# Patient Record
Sex: Female | Born: 1935 | Race: White | Hispanic: No
Health system: Southern US, Community
[De-identification: ages and names within clinical notes are randomized; demographics above are authoritative.]

## PROBLEM LIST (undated history)

## (undated) HISTORY — PX: BREAST CYST ASPIRATION: SHX578

## (undated) HISTORY — PX: BREAST EXCISIONAL BIOPSY: SUR124

---

## 2004-02-05 ENCOUNTER — Ambulatory Visit (HOSPITAL_COMMUNITY): Admission: RE | Admit: 2004-02-05 | Discharge: 2004-02-05 | Payer: Self-pay | Admitting: Gastroenterology

## 2007-03-17 ENCOUNTER — Ambulatory Visit: Payer: Self-pay | Admitting: Internal Medicine

## 2012-10-19 ENCOUNTER — Observation Stay: Payer: Self-pay | Admitting: Internal Medicine

## 2012-10-19 LAB — COMPREHENSIVE METABOLIC PANEL
Albumin: 4.2 g/dL (ref 3.4–5.0)
Alkaline Phosphatase: 109 U/L (ref 50–136)
Anion Gap: 12 (ref 7–16)
Chloride: 107 mmol/L (ref 98–107)
EGFR (Non-African Amer.): >60
Glucose: 89 mg/dL (ref 65–99)
Osmolality: 286 (ref 275–301)
Potassium: 4 mmol/L (ref 3.5–5.1)
SGOT(AST): 24 U/L (ref 15–37)
Sodium: 142 mmol/L (ref 136–145)
Total Protein: 7.2 g/dL (ref 6.4–8.2)

## 2012-10-19 LAB — CBC
HCT: 45.4 % (ref 35.0–47.0)
MCH: 30.4 pg (ref 26.0–34.0)
MCV: 89 fL (ref 80–100)
Platelet: 301 10*3/uL (ref 150–440)
RDW: 13.8 % (ref 11.5–14.5)

## 2012-10-19 LAB — URINALYSIS, COMPLETE
Nitrite: NEGATIVE
Protein: NEGATIVE
RBC,UR: 1 /HPF (ref 0–5)
Specific Gravity: 1.005 (ref 1.003–1.030)
WBC UR: 3 /HPF (ref 0–5)

## 2012-10-19 LAB — TROPONIN I: Troponin-I: <0.02 ng/mL

## 2012-10-19 LAB — CK TOTAL AND CKMB (NOT AT ARMC)
CK, Total: 65 U/L (ref 21–215)
CK-MB: <0.5 ng/mL — ABNORMAL LOW (ref 0.5–3.6)

## 2012-10-20 LAB — LIPID PANEL
HDL Cholesterol: 38 mg/dL — ABNORMAL LOW (ref 40–60)
VLDL Cholesterol, Calc: 56 mg/dL — ABNORMAL HIGH (ref 5–40)

## 2015-04-01 NOTE — Discharge Summary (Signed)
PATIENT NAME:  Cheyenne HansenROLAND, Cheyenne Glover#:  [id number] DATE OF BIRTH:  05/11/1934  DATE OF ADMISSION:  10/19/2012 DATE OF DISCHARGE:  10/20/2012  PRESENTING COMPLAINT: Right upper extremity weakness and dysarthria.   DISCHARGE DIAGNOSES:  1. Suspected TIA, resolved.  2. Accelerated hypertension.  3. Hyperlipidemia.   CONDITION ON DISCHARGE: Fair. Vitals stable.  MEDICATIONS. 1. Restasis 0.05 ophthalmic drops one drop to each affected eye b.i.d.  2. Coreg 12.5 mg p.o. b.i.d.  3. Lisinopril 40 mg p.o. daily.  4. Clonidine 0.1 mg p.o. b.i.d.  5. Dicyclomine 1 capsule orally as needed.  6. Aspirin 325 mg p.o. daily.  7. Zetia 10 mg p.o. daily.   DIET: Low sodium, low fat, cholesterol diet.   FOLLOW-UP: Follow-up with [facility] doctor, Dr. Dayna BarkerAldridge, on November 18th at 10:30 a.m.   LABORATORY, DIAGNOSTIC, AND RADIOLOGICAL DATA: MRI of the brain negative for acute infarct.   Ultrasound carotid Doppler no significant stenosis. Scattered atherosclerotic disease.   Cholesterol 226, triglycerides 280, LDL 132. CBC within normal limits. Comprehensive metabolic panel within normal limits. Urinalysis negative for UTI.   CT head no acute or focal stroke.   BRIEF SUMMARY OF HOSPITAL COURSE: Ms. Cheyenne Glover is a pleasant 81 year old Caucasian female who has history of hypertension and hyperlipidemia who came to the Emergency Room with:  1. Possible TIA. The patient presented with symptoms of right upper extremity weakness and some dysarthria. She was at church, started dropping objects from her right hand and felt weak and poorly. Her blood pressure had been elevated in the 200's systolic. She was started on aspirin 325 mg p.o. daily. The patient's symptoms resolved in 2 to 3 hours. She is back to her baseline. Her work-up for stroke essentially is negative. The patient does not need any physical therapy at this time. She is back at her baseline.  2. Hypertension, labile. The patient is currently on  Coreg, lisinopril, and clonidine. Her Coreg dose was increased to 12.5 recently. The patient is advised to keep a log of her blood pressure. Follow-up with primary care physician and adjustment of BP meds can be done as outpatient.  3. Irritable bowel syndrome. P.r.n. dicyclomine.  4. Hyperlipidemia. The patient has been intolerant to statins. I discussed with her regarding starting Zetia. She is okay to try it, hence, a prescription for Zetia has been given.   Hospital stay otherwise remained stable. The patient's discharge plan was discussed with her family members.   CODE STATUS: She remained a FULL CODE.   TIME SPENT: 40 minutes.   ____________________________ Wylie HailSona A. Allena KatzPatel, MD sap:drc D: 10/20/2012 14:54:38 ET T: 10/21/2012 15:34:11 ET JOB#: [id number]  cc: Isatu Macinnes A. Allena KatzPatel, MD, <Dictator> Katina DungBarbara D. Dayna BarkerAldridge, MD Willow OraSONA A Raffaele Derise MD ELECTRONICALLY SIGNED 10/24/2012 1:24

## 2015-04-01 NOTE — H&P (Signed)
PATIENT NAME:  Cheyenne Glover, Cheyenne Glover MR#:  [id number] DATE OF BIRTH:  [birth year]  DATE OF ADMISSION:  10/19/2012  REFERRING PHYSICIAN: ER physician, Dr. Enedina Finner    PRIMARY CARE PHYSICIAN: Dr. Dayna Barker   CARDIOLOGIST: Dr. Gwen Pounds   CHIEF COMPLAINT: Slurred speech, right hand numbness, weakness.   HISTORY OF PRESENT ILLNESS: The patient is a 81 year old female with past medical history of irritable bowel syndrome and hypertension who reports that four days ago she went to see her PCP and was having uncontrolled hypertension with systolic blood pressure in the 200's, therefore, the dose of her Coreg was increased from 6.25 to 12.5 twice a day. She has been taking the medications for the last few days and this morning her blood pressure was low at 94/54. She went to church with a friend and over there she noticed that her speech was slurred. By the time she got home, which was about 10 minutes later, she noticed that her right hand, especially the thumb and the index finger, felt numb and her grip strength did not feel good. She was dropping objects from the right hand. By the time she came to the Emergency Room, her symptoms had started resolving and she currently feels back to her baseline. Dr. Gwen Pounds is her cardiologist and he was planning to do an echo on her tomorrow. She also takes ibuprofen frequently for arthritis pain and she was advised not to take it anymore and has not done so in the last few days.   ALLERGIES: Codeine.  MEDICATIONS:  1. Clonidine 0.1 mg twice a day. 2. Coreg 12.5 mg twice a day.  3. Lisinopril 40 mg daily.  4. Restasis 0.05% one drop to each affected eye twice a day.  5. The patient also takes dicyclomine for irritable bowel syndrome/diarrhea.   PAST MEDICAL HISTORY:  1. Hypertension.  2. Irritable bowel syndrome with diarrhea.   PAST SURGICAL HISTORY:  1. Hysterectomy.  2. Appendectomy.  3. D and C. 4. Tonsillectomy.  5. Nose surgery.  6. Left shoulder rotator  cuff repair.   FAMILY HISTORY: Mother had chronic lung disease and died at the age of 37. Father had CHF and died at the age of 18.   SOCIAL HISTORY: She is married, lives with her husband. There is no history of smoking, alcohol, or drug abuse.   REVIEW OF SYSTEMS: CONSTITUTIONAL: Denies any fever, fatigue, weakness. EYES: Denies any blurred or double vision. ENT: Denies any tinnitus or ear pain. RESPIRATORY: Denies any cough or wheezing. CARDIOVASCULAR: Denies any chest pain or palpitations. GI: Denies any nausea, vomiting, abdominal pain. Has intermittent diarrhea due to irritable bowel syndrome. GU: Denies any dysuria or hematuria. ENDOCRINE: Denies any polyuria or nocturia. HEME/LYMPH: Denies any anemia or easy bruisability. INTEGUMENTARY: Denies any acne or rash. MUSCULOSKELETAL: Denies any swelling or gout. NEUROLOGICAL: Had some numbness and weakness in her right hand earlier. PSYCH: Denies any anxiety or depression.   PHYSICAL EXAMINATION:   VITAL SIGNS: Temperature 98.3, heart rate 59, respiratory rate 18, blood pressure 181/86, currently it is 131/58, pulse oximetry 95% on room air.   GENERAL: The patient is a 81 year old female sitting comfortably in bed not in acute distress.   HEAD: Atraumatic, normocephalic.   EYES: No pallor, icterus, or cyanosis. PERRLA. EOMI.   ENT: Wet mucous membranes. No oropharyngeal erythema or thrush.   NECK: Supple. No masses. No JVD. No thyromegaly. No lymphadenopathy.   CHEST WALL: No tenderness to palpation. Not using accessory muscles of respiration. No intercostal  muscle retractions.   LUNGS: Bilaterally clear to auscultation. No wheezing, rales, or rhonchi.   CARDIOVASCULAR: S1, S2 regular. No murmur, rubs, or gallops.   ABDOMEN: Soft, nontender, nondistended. No guarding. No rigidity. No organomegaly. Normal bowel sounds.   SKIN: No rashes or lesions.   PERIPHERIES: No pedal edema. 2+ pedal pulses.   MUSCULOSKELETAL: No cyanosis or  clubbing.   NEUROLOGIC: Awake, alert, oriented x3. Nonfocal neurological exam. Cranial nerves grossly intact.   PSYCH: Normal mood and affect.   RESULTS: CT of the head shows no acute abnormalities.   Urinalysis showed no evidence of infection. CBC is normal. CMP is also normal. Cardiac enzymes normal.   ASSESSMENT AND PLAN: This is a 81 year old female with past medical history of hypertension and irritable bowel syndrome who presents with right hand numbness, weakness, slurred speech.  1. Possible TIA. The patient's presenting symptoms have essentially resolved at present. Will admit her for observation. Monitor her on telemetry. Will get an MRI of the brain, echo, and carotid ultrasound. She occasionally takes a baby aspirin. Will give her a full dose while in the hospital and check a fasting lipid profile.  2. Hypertension. The patient reports that the dose of her medications was recently increased about four days ago because of uncontrolled blood pressure with systolic blood pressures running in the 200's. This morning her blood pressure was low. On presentation to the ER her blood pressure was high, currently it is 131/58. Will reduce the dose of her antihypertensive medications to avoid hypertension in view of her recent TIA symptoms.  3. Irritable bowel syndrome. Will continue p.r.n. dicyclomine.  Discussed with the ER physician, discussed with the patient and her daughter the plan of care and management.   TIME SPENT: 75 minutes.   ____________________________ Darrick MeigsSangeeta Shantese Raven, MD sp:drc D: 10/19/2012 15:42:24 ET T: 10/19/2012 16:05:43 ET JOB#: [id number]  cc: Darrick MeigsSangeeta Lener Ventresca, MD, <Dictator> Katina DungBarbara D. Dayna BarkerAldridge, MD Darrick MeigsSANGEETA Dianca Owensby MD ELECTRONICALLY SIGNED 10/19/2012 18:54

## 2018-08-29 ENCOUNTER — Other Ambulatory Visit: Payer: Self-pay | Admitting: Family Medicine

## 2018-08-29 DIAGNOSIS — Z780 Asymptomatic menopausal state: Secondary | ICD-10-CM

## 2019-10-08 ENCOUNTER — Other Ambulatory Visit: Payer: Self-pay | Admitting: Physician Assistant

## 2019-10-08 ENCOUNTER — Other Ambulatory Visit (HOSPITAL_COMMUNITY): Payer: Self-pay | Admitting: Physician Assistant

## 2019-10-08 DIAGNOSIS — M542 Cervicalgia: Secondary | ICD-10-CM

## 2019-10-08 DIAGNOSIS — R221 Localized swelling, mass and lump, neck: Secondary | ICD-10-CM

## 2019-10-15 ENCOUNTER — Other Ambulatory Visit: Payer: Self-pay

## 2019-10-15 ENCOUNTER — Ambulatory Visit: Payer: Medicare Other

## 2019-10-15 ENCOUNTER — Ambulatory Visit
Admission: RE | Admit: 2019-10-15 | Discharge: 2019-10-15 | Disposition: A | Discharge status: Home / Self Care | Payer: Medicare Other | Source: Ambulatory Visit | Attending: Physician Assistant | Admitting: Physician Assistant

## 2019-10-15 DIAGNOSIS — R221 Localized swelling, mass and lump, neck: Secondary | ICD-10-CM | POA: Diagnosis not present

## 2019-10-15 DIAGNOSIS — M542 Cervicalgia: Secondary | ICD-10-CM | POA: Diagnosis present

## 2020-05-01 ENCOUNTER — Other Ambulatory Visit
Admission: RE | Admit: 2020-05-01 | Discharge: 2020-05-01 | Disposition: A | Discharge status: Home / Self Care | Payer: Medicare Other | Source: Home / Self Care | Attending: Physician Assistant | Admitting: Physician Assistant

## 2020-05-01 ENCOUNTER — Other Ambulatory Visit: Payer: Self-pay

## 2020-05-01 ENCOUNTER — Other Ambulatory Visit: Payer: Self-pay | Admitting: Physician Assistant

## 2020-05-01 ENCOUNTER — Ambulatory Visit
Admission: RE | Admit: 2020-05-01 | Discharge: 2020-05-01 | Disposition: A | Discharge status: Home / Self Care | Payer: Medicare Other | Source: Ambulatory Visit | Attending: Physician Assistant | Admitting: Physician Assistant

## 2020-05-01 DIAGNOSIS — R1032 Left lower quadrant pain: Secondary | ICD-10-CM | POA: Insufficient documentation

## 2020-05-01 LAB — CREATININE, SERUM
Creatinine, Ser: 1.05 mg/dL — ABNORMAL HIGH (ref 0.44–1.00)
GFR calc Af Amer: 57 mL/min — ABNORMAL LOW (ref >60)
GFR calc non Af Amer: 49 mL/min — ABNORMAL LOW (ref >60)

## 2020-05-01 MED ORDER — IOHEXOL 300 MG/ML  SOLN
100.0000 mL | Freq: Once | INTRAMUSCULAR | Status: AC | PRN
  Administered 2020-05-01: 85 mL via INTRAVENOUS

## 2020-06-19 ENCOUNTER — Other Ambulatory Visit: Payer: Self-pay | Admitting: Family Medicine

## 2020-06-19 ENCOUNTER — Other Ambulatory Visit: Payer: Self-pay

## 2020-06-19 ENCOUNTER — Ambulatory Visit
Admission: RE | Admit: 2020-06-19 | Discharge: 2020-06-19 | Disposition: A | Discharge status: Home / Self Care | Payer: Medicare Other | Attending: Family Medicine | Admitting: Family Medicine

## 2020-06-19 ENCOUNTER — Ambulatory Visit
Admission: RE | Admit: 2020-06-19 | Discharge: 2020-06-19 | Disposition: A | Discharge status: Home / Self Care | Payer: Medicare Other | Source: Ambulatory Visit | Attending: Family Medicine | Admitting: Family Medicine

## 2020-06-19 DIAGNOSIS — M7989 Other specified soft tissue disorders: Secondary | ICD-10-CM | POA: Insufficient documentation

## 2021-01-15 IMAGING — CT CT ABD-PELV W/ CM
1 of 3 series · 12 of 32 positions shown, 17 images · IV contrast (APPLIED)
Comparison: None.

CLINICAL DATA: Lower abdominal/pelvic region pain

EXAM:
CT ABDOMEN AND PELVIS WITH CONTRAST
TECHNIQUE: Multidetector CT imaging of the abdomen and pelvis was performed
using the standard protocol following bolus administration of
intravenous contrast. Oral contrast was also administered.
CONTRAST:  85mL OMNIPAQUE IOHEXOL 300 MG/ML  SOLN

[Series 2: axial st · axial · 0.68mm/px · z∈[-1043,-648]mm · 12 of 89 slices shown, 17 images]
[im 5/89  soft-tissue]
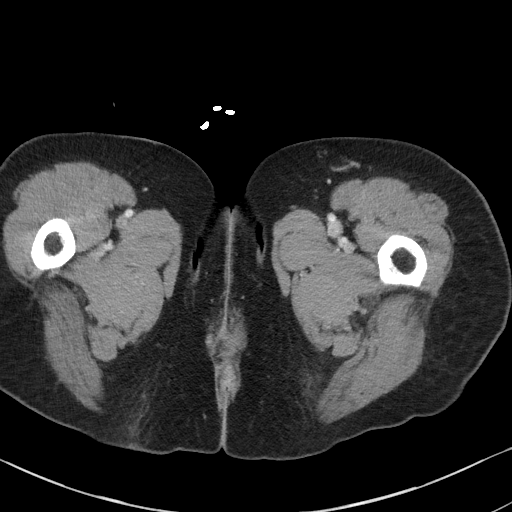
[im 5/89  bone]
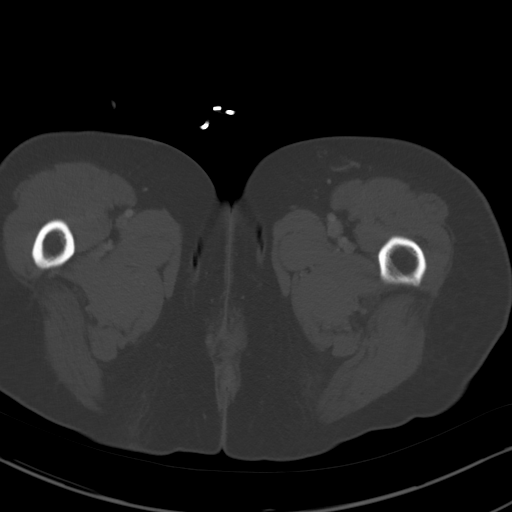
[im 15/89  soft-tissue]
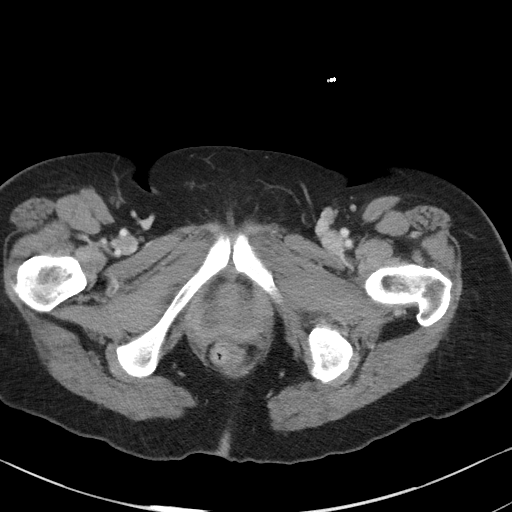
[im 20/89  soft-tissue]
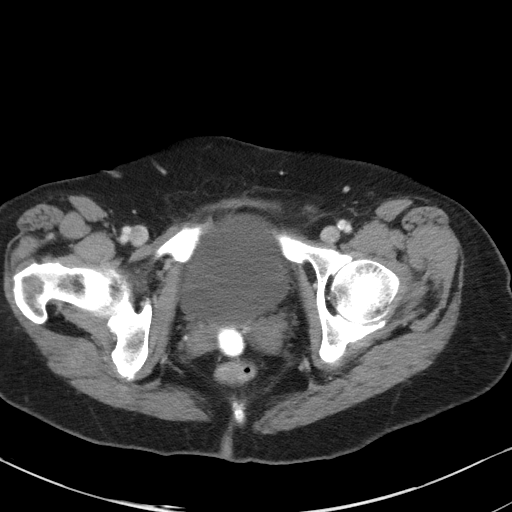
[im 30/89  soft-tissue]
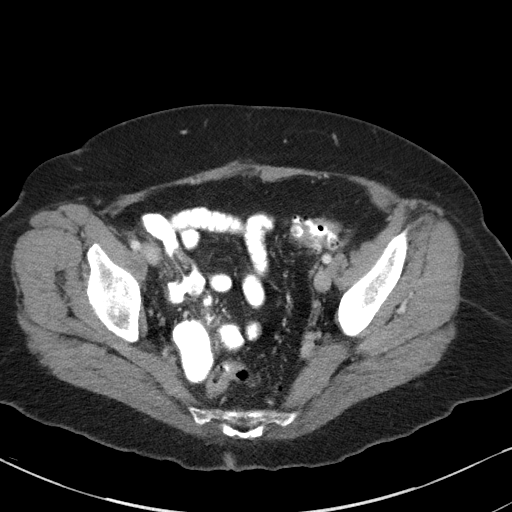
[im 35/89  soft-tissue]
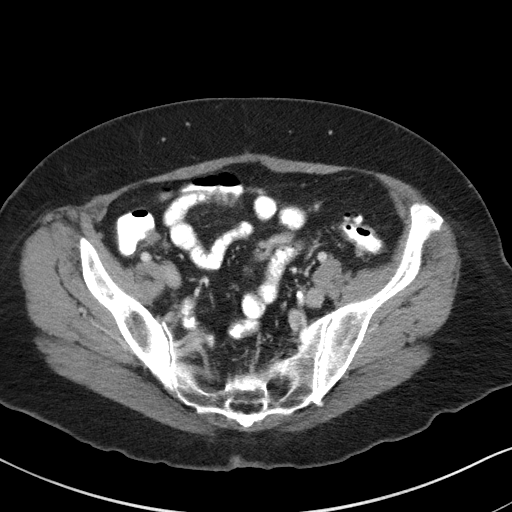
[im 45/89  soft-tissue]
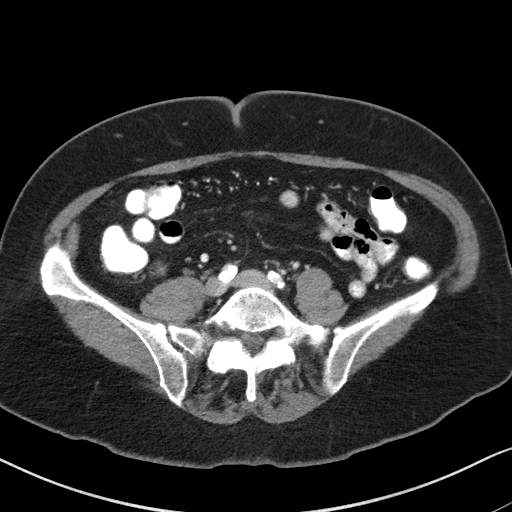
[im 54/89  soft-tissue]
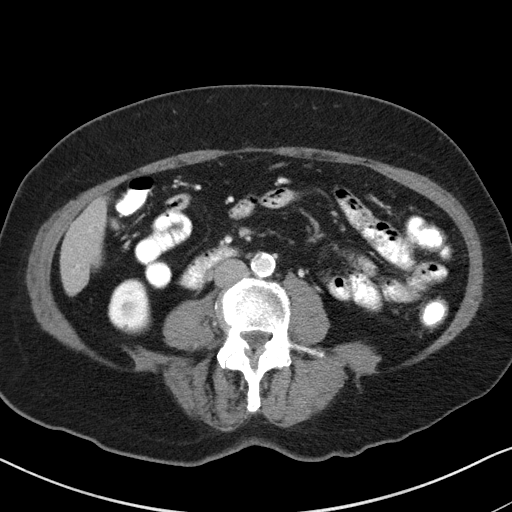
[im 59/89  soft-tissue]
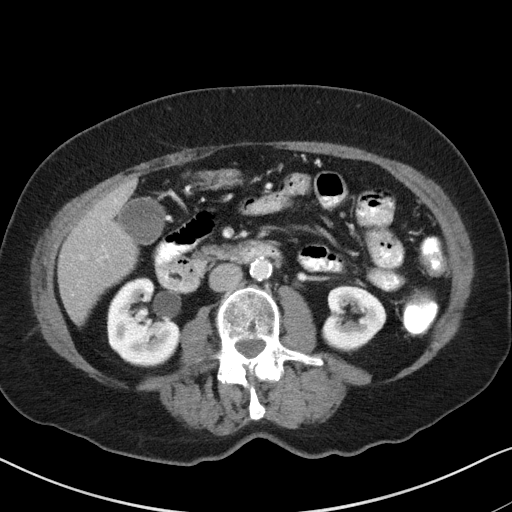
[im 69/89  soft-tissue]
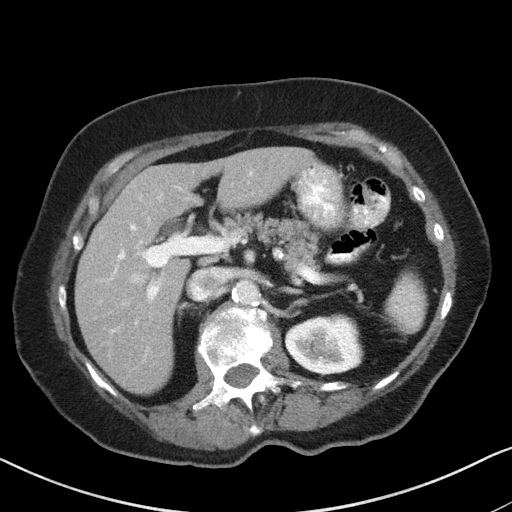
[im 69/89  lung]
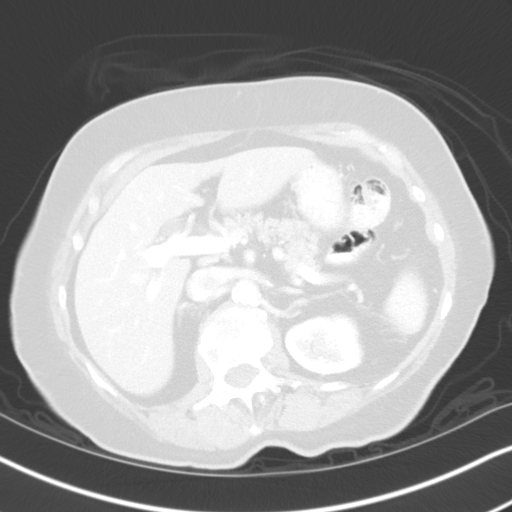
[im 69/89  bone]
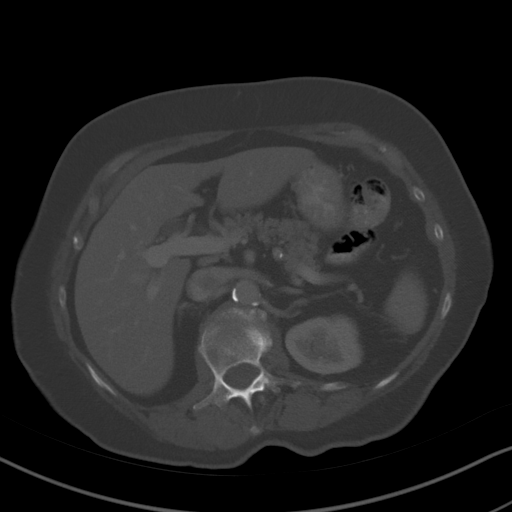
[im 74/89  soft-tissue]
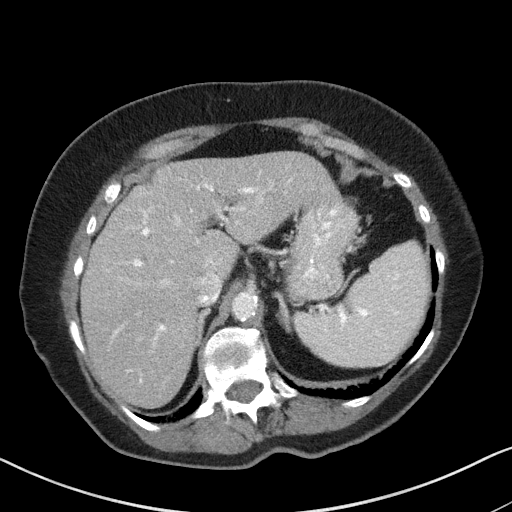
[im 74/89  lung]
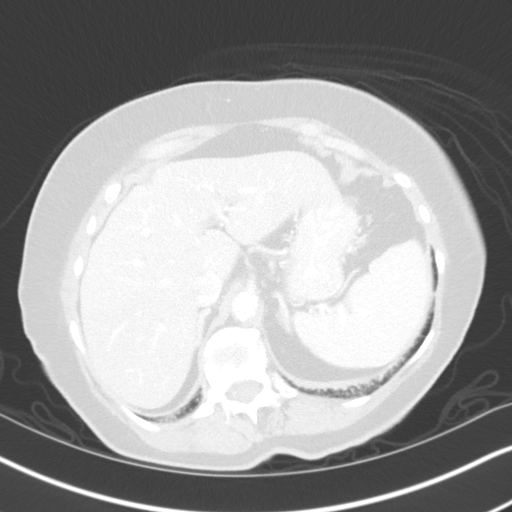
[im 79/89  lung]
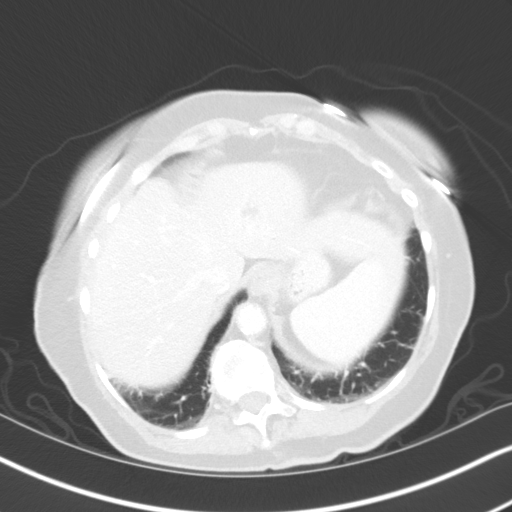
[im 84/89  soft-tissue]
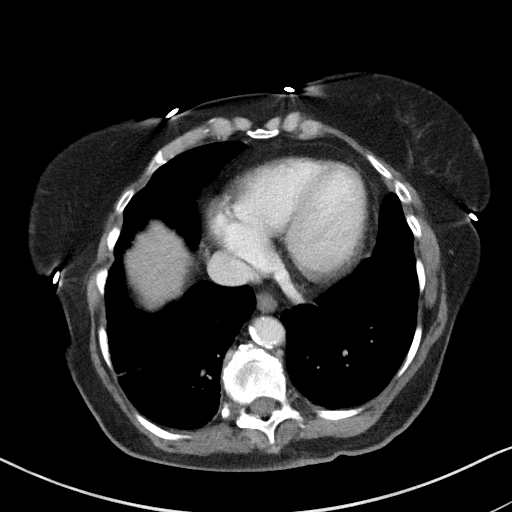
[im 84/89  lung]
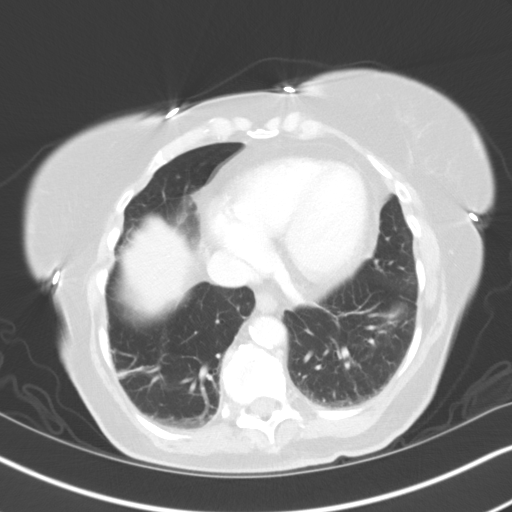

[12 of 32 positions shown; findings below may reference images not displayed]

FINDINGS: Lower chest: There is bibasilar scarring. There is no lung base
edema or airspace opacity. There is a small hiatal hernia.

Hepatobiliary: There is a degree of hepatic steatosis. There is a
suspected cyst in the left lobe of the liver measuring 1.3 x 1.2 cm.
No other focal liver lesions are appreciable. The gallbladder wall
is not appreciably thickened. The proximal common bile duct measures
8 mm, prominent. There is slight intrahepatic biliary duct
prominence. No biliary duct mass or calculus evident.

Pancreas: No pancreatic mass or inflammatory focus evident.

Spleen: No splenic lesions evident.

Adrenals/Urinary Tract: Adrenals bilaterally appear normal. There
are occasional subcentimeter cysts in the left kidney. Kidneys
bilaterally show no evident hydronephrosis on either side. There is
an extrarenal pelvis on each side, an anatomic variant. There is no
evident renal or ureteral calculus on either side. Urinary bladder
is midline with wall thickness within normal limits.

Stomach/Bowel: There are multiple sigmoid diverticula. Mild wall
thickening in the sigmoid colon likely is due to muscular
hypertrophy from chronic diverticulosis. There is mild soft
tissue/mesenteric thickening and minimal fluid in the proximal most
aspect of the sigmoid colon, likely early diverticulitis. No
associated abscess or perforation.

No similar bowel wall thickening or mesenteric thickening elsewhere.
The terminal ileum appears unremarkable. No evident bowel
obstruction. No free air or portal venous air.

Vascular/Lymphatic: There is no evident abdominal aortic aneurysm.
There is extensive aortic and iliac artery atherosclerosis. There
are areas of subtotal occlusion in each common iliac artery with
severe calcification in these areas. Major venous structures appear
normal. There is no evident adenopathy in the abdomen or pelvis.

Reproductive: Uterus either atrophic or absent. No evident pelvic
mass.

Other: No appendiceal region inflammation evident. No abscess or
ascites is appreciable in the abdomen or pelvis.

Musculoskeletal: There is anterior wedging of the T12 vertebral
body, age uncertain. There is degenerative change in lower thoracic
and lumbar regions. No blastic or lytic bone lesions. No appreciable
intramuscular or abdominal wall lesions.
IMPRESSION: 1. Short segment of diverticulitis in the proximal most aspect of
the sigmoid colon. There is localized wall thickening as well as
mesenteric thickening and minimal fluid in this area but no abscess
or perforation.

2. Extensive diverticulosis throughout the remainder of the sigmoid
colon.

3.  No bowel obstruction.  No abscess in the abdomen or pelvis.

4.  Age uncertain anterior wedging of the T12 vertebral body.

5. Aortic Atherosclerosis ([id number]). There is also extensive
iliac artery atherosclerotic plaque with apparent subtotal occlusion
in each proximal common iliac artery.

6.  Small hiatal hernia.

7. Hepatic steatosis. Mild prominence of the biliary ductal system
without mass or calculus evident by CT.

These results will be called to the ordering clinician or
representative by the Radiologist Assistant, and communication
documented in the PACS or [REDACTED].

## 2023-09-08 ENCOUNTER — Other Ambulatory Visit: Payer: Self-pay | Admitting: Family Medicine

## 2023-09-08 DIAGNOSIS — Z1231 Encounter for screening mammogram for malignant neoplasm of breast: Secondary | ICD-10-CM

## 2023-09-21 ENCOUNTER — Inpatient Hospital Stay: Admission: RE | Admit: 2023-09-21 | Payer: Medicare Other | Source: Ambulatory Visit

## 2023-09-26 ENCOUNTER — Ambulatory Visit
Admission: RE | Admit: 2023-09-26 | Discharge: 2023-09-26 | Disposition: A | Discharge status: Home / Self Care | Payer: Medicare Other | Source: Ambulatory Visit | Attending: Family Medicine | Admitting: Family Medicine

## 2023-09-26 DIAGNOSIS — Z1231 Encounter for screening mammogram for malignant neoplasm of breast: Secondary | ICD-10-CM | POA: Diagnosis present

## 2023-09-27 ENCOUNTER — Inpatient Hospital Stay
Admission: RE | Admit: 2023-09-27 | Discharge: 2023-09-27 | Disposition: A | Discharge status: Home / Self Care | Payer: Self-pay | Source: Ambulatory Visit | Attending: Family Medicine | Admitting: Family Medicine

## 2023-09-27 ENCOUNTER — Other Ambulatory Visit: Payer: Self-pay | Admitting: *Deleted

## 2023-09-27 DIAGNOSIS — Z1231 Encounter for screening mammogram for malignant neoplasm of breast: Secondary | ICD-10-CM
